# Patient Record
Sex: Male | Born: 1970 | Race: White | Hispanic: No | Marital: Married | Smoking: Never smoker
Health system: Southern US, Community
[De-identification: ages and names within clinical notes are randomized; demographics above are authoritative.]

---

## 2017-07-18 ENCOUNTER — Emergency Department (HOSPITAL_COMMUNITY)
Admission: EM | Admit: 2017-07-18 | Discharge: 2017-07-18 | Disposition: A | Discharge status: Home / Self Care | Payer: BLUE CROSS/BLUE SHIELD | Attending: Emergency Medicine | Admitting: Emergency Medicine

## 2017-07-18 ENCOUNTER — Emergency Department (HOSPITAL_COMMUNITY): Payer: BLUE CROSS/BLUE SHIELD

## 2017-07-18 ENCOUNTER — Other Ambulatory Visit: Payer: Self-pay

## 2017-07-18 ENCOUNTER — Encounter (HOSPITAL_COMMUNITY): Payer: Self-pay | Admitting: Emergency Medicine

## 2017-07-18 DIAGNOSIS — K529 Noninfective gastroenteritis and colitis, unspecified: Secondary | ICD-10-CM | POA: Diagnosis not present

## 2017-07-18 DIAGNOSIS — R1030 Lower abdominal pain, unspecified: Secondary | ICD-10-CM | POA: Diagnosis present

## 2017-07-18 LAB — COMPREHENSIVE METABOLIC PANEL
ALK PHOS: 50 U/L (ref 38–126)
ALT: 21 U/L (ref 17–63)
AST: 15 U/L (ref 15–41)
Albumin: 3.8 g/dL (ref 3.5–5.0)
Anion gap: 11 (ref 5–15)
BILIRUBIN TOTAL: 0.7 mg/dL (ref 0.3–1.2)
BUN: 12 mg/dL (ref 6–20)
CALCIUM: 9.2 mg/dL (ref 8.9–10.3)
CO2: 24 mmol/L (ref 22–32)
CREATININE: 1.17 mg/dL (ref 0.61–1.24)
Chloride: 97 mmol/L — ABNORMAL LOW (ref 101–111)
Glucose, Bld: 106 mg/dL — ABNORMAL HIGH (ref 65–99)
Potassium: 3.4 mmol/L — ABNORMAL LOW (ref 3.5–5.1)
Sodium: 132 mmol/L — ABNORMAL LOW (ref 135–145)
TOTAL PROTEIN: 8.4 g/dL — AB (ref 6.5–8.1)

## 2017-07-18 LAB — LIPASE, BLOOD: Lipase: 25 U/L (ref 11–51)

## 2017-07-18 LAB — CBC
HCT: 47.8 % (ref 39.0–52.0)
Hemoglobin: 16.5 g/dL (ref 13.0–17.0)
MCH: 29 pg (ref 26.0–34.0)
MCHC: 34.5 g/dL (ref 30.0–36.0)
MCV: 84 fL (ref 78.0–100.0)
PLATELETS: 253 10*3/uL (ref 150–400)
RBC: 5.69 MIL/uL (ref 4.22–5.81)
RDW: 14 % (ref 11.5–15.5)
WBC: 8.6 10*3/uL (ref 4.0–10.5)

## 2017-07-18 MED ORDER — AMOXICILLIN-POT CLAVULANATE 875-125 MG PO TABS: 1.0000 | ORAL_TABLET | Freq: Two times a day (BID) | ORAL | 0 refills | Status: AC

## 2017-07-18 MED ORDER — IOPAMIDOL (ISOVUE-300) INJECTION 61%
INTRAVENOUS | Status: AC
  Administered 2017-07-18: 100 mL
  Filled 2017-07-18: qty 100

## 2017-07-18 MED ORDER — ONDANSETRON 4 MG PO TBDP: 4.0000 mg | ORAL_TABLET | Freq: Three times a day (TID) | ORAL | 0 refills | Status: AC | PRN

## 2017-07-18 NOTE — ED Provider Notes (Signed)
Patient placed in Quick Look pathway, seen and evaluated   Chief Complaint: RLQ abdominal pain, diarrhea  HPI:   To the ED with abdominal complaints.  Patient states 3 days ago he began having chills, and yesterday he had nonbloody diarrhea, about 12-14 episodes.  He states today he began having right lower quadrant abdominal pain, worse with movement.  States the diarrhea has significantly decreased.  Denies associated nausea or vomiting, fever, urinary symptoms, or other complaints.  No history of abdominal surgeries.  No recent antibiotics or hospital admissions.  ROS: + Abdominal pain, + diarrhea  Physical Exam:   Gen: No distress  Neuro: Awake and Alert  Skin: Warm    Focused Exam: Abdomen is soft.  No guarding, however positive rebound tenderness in right lower quadrant and left lower quadrant.  + Rovsing.   Initiation of care has begun. The patient has been counseled on the process, plan, and necessity for staying for the completion/evaluation, and the remainder of the medical screening examination    Robinson, [place] N, PA-C 07/18/17 1539    Mancel BaleWentz, Elliott, MD 07/19/17 1026

## 2017-07-18 NOTE — ED Notes (Signed)
Pt. Called for VS. No Answer

## 2017-07-18 NOTE — Discharge Instructions (Signed)
Please read instructions below. Drink clear liquids until your stomach feels better. Then, slowly introduce bland foods into your diet as tolerated, such as bread, rice, apples, bananas. It is important to stay hydrated. You can take zofran every 8 hours as needed for nausea. Begin taking the antibiotic, Augmentin, every 12 hours until it is gone. Follow up with your primary care. Return to the ER for severely worsening abdominal pain, fever, uncontrollable vomiting, or new or concerning symptoms.

## 2017-07-18 NOTE — ED Triage Notes (Signed)
Pt endorses having chills 3 days ago, diarrhea that began yesterday 12-14 times. Pt began having RLQ abd pain this morning that is better with palpation and worse when releasing pressure. Tachy in triage.

## 2017-07-18 NOTE — ED Provider Notes (Signed)
[facility]   CSN: [id number] Arrival date & time: 07/18/17  1444     History   Chief Complaint Chief Complaint  Patient presents with  . Abdominal Pain    HPI Wesley Franklin is a 47 y.o. male without significant PMHX, presenting to the ED with abdominal complaints.  Patient states 3 days ago he began having chills, and yesterday he had nonbloody diarrhea, about 12-14 episodes.  He states today he began having right lower quadrant abdominal pain, worse with movement. Pain is minimal at rest. States the diarrhea has significantly decreased since that time.  Denies associated nausea or vomiting, fever, urinary symptoms, or other complaints.  No history of abdominal surgeries.  No recent antibiotics or hospital admissions. No recent out of country travel. Denies drinking from questionable water sources.  The history is provided by the patient.    History reviewed. No pertinent past medical history.  There are no active problems to display for this patient.   History reviewed. No pertinent surgical history.     Home Medications    Prior to Admission medications   Medication Sig Start Date End Date Taking? Authorizing Provider  amoxicillin-clavulanate (AUGMENTIN) 875-125 MG tablet Take 1 tablet by mouth every 12 (twelve) hours for 10 days. 07/18/17 07/28/17  Robinson, [place] N, PA-C  ondansetron (ZOFRAN ODT) 4 MG disintegrating tablet Take 1 tablet (4 mg total) by mouth every 8 (eight) hours as needed for nausea or vomiting. 07/18/17   Robinson, [place] N, PA-C    Family History History reviewed. No pertinent family history.  Social History Social History   Tobacco Use  . Smoking status: Never Smoker  Substance Use Topics  . Alcohol use: No    Frequency: Never  . Drug use: No     Allergies   Patient has no known allergies.   Review of Systems Review of Systems  Constitutional: Positive for chills. Negative for  fever.  Gastrointestinal: Positive for abdominal pain and diarrhea. Negative for blood in stool, nausea and vomiting.  Genitourinary: Negative for dysuria and frequency.  Allergic/Immunologic: Negative for immunocompromised state.  All other systems reviewed and are negative.    Physical Exam Updated Vital Signs BP 122/90   Pulse 90   Temp 99.2 F (37.3 C) (Oral)   Resp 16   Ht 6' (1.829 m)   Wt 90.7 kg (200 lb)   SpO2 96%   BMI 27.12 kg/m   Physical Exam  Constitutional: He appears well-developed and well-nourished.  Non-toxic appearance. He does not appear ill. No distress.  HENT:  Head: Normocephalic and atraumatic.  Mouth/Throat: Oropharynx is clear and moist.  Eyes: Conjunctivae are normal.  Cardiovascular: Normal rate, regular rhythm, normal heart sounds and intact distal pulses.  Pulmonary/Chest: Effort normal and breath sounds normal. No respiratory distress.  Abdominal: Soft. Normal appearance and bowel sounds are normal. He exhibits no distension and no mass. There is rebound. There is no rigidity and no guarding. No hernia.  No tenderness with direction palpation though rebound tenderness is present RLQ and LLQ. Positive Rovsing's.   Neurological: He is alert.  Skin: Skin is warm.  Psychiatric: He has a normal mood and affect. His behavior is normal.  Nursing note and vitals reviewed.    ED Treatments / Results  Labs (all labs ordered are listed, but only abnormal results are displayed) Labs Reviewed  COMPREHENSIVE METABOLIC PANEL - Abnormal; Notable for the following components:      Result  Value   Sodium 132 (*)    Potassium 3.4 (*)    Chloride 97 (*)    Glucose, Bld 106 (*)    Total Protein 8.4 (*)    All other components within normal limits  LIPASE, BLOOD  CBC    EKG  EKG Interpretation None       Radiology Ct Abdomen Pelvis W Contrast  Result Date: 07/18/2017 CLINICAL DATA:  Right lower quadrant pain with diarrhea and chills. EXAM: CT  ABDOMEN AND PELVIS WITH CONTRAST TECHNIQUE: Multidetector CT imaging of the abdomen and pelvis was performed using the standard protocol following bolus administration of intravenous contrast. CONTRAST:  ISOVUE-300 IOPAMIDOL (ISOVUE-300) INJECTION 61% COMPARISON:  None. FINDINGS: Lower chest: Unremarkable. Hepatobiliary: No focal abnormality within the liver parenchyma. There is no evidence for gallstones, gallbladder wall thickening, or pericholecystic fluid. No intrahepatic or extrahepatic biliary dilation. Pancreas: No focal mass lesion. No dilatation of the main duct. No intraparenchymal cyst. No peripancreatic edema. Spleen: No splenomegaly. No focal mass lesion. Adrenals/Urinary Tract: No adrenal nodule or mass. Central sinus and parenchymal cysts noted left kidney. Right kidney unremarkable. No evidence for hydroureter. The urinary bladder appears normal for the degree of distention. Stomach/Bowel: Stomach is nondistended. No gastric wall thickening. No evidence of outlet obstruction. Duodenum is normally positioned as is the ligament of Treitz. No small bowel wall thickening. No small bowel dilatation. Terminal ileum unremarkable. The appendix is normal. Wall thickening is identified in the right colon with pericolonic edema/inflammation. Numerous small lymph nodes are seen in the pericolonic fat of the ileocolic mesentery. Transverse and left colon unremarkable. Vascular/Lymphatic: No abdominal aortic aneurysm. No abdominal aortic atherosclerotic calcification. Portal vein and superior mesenteric vein are patent. Celiac axis, SMA, and IMA are patent. No retroperitoneal lymphadenopathy. No pelvic sidewall lymphadenopathy. Reproductive: The prostate gland and seminal vesicles have normal imaging features. Other: Small volume intraperitoneal free fluid evident. Musculoskeletal: Bone windows reveal no worrisome lytic or sclerotic osseous lesions. IMPRESSION: 1. Circumferential wall thickening and edema in  the cecum with pericolonic edema/inflammation and adjacent mild lymphadenopathy in the ileocolic mesentery. Imaging features most suggestive of an infectious or inflammatory right colitis. Electronically Signed   By: Kennith Center M.D.   On: 07/18/2017 17:56    Procedures Procedures (including critical care time)  Medications Ordered in ED Medications  iopamidol (ISOVUE-300) 61 % injection (100 mLs  Contrast Given 07/18/17 1727)     Initial Impression / Assessment and Plan / ED Course  I have reviewed the triage vital signs and the nursing notes.  Pertinent labs & imaging results that were available during my care of the patient were reviewed by me and considered in my medical decision making (see chart for details).    Pt w RLQ abdominal pain and diarrhea. Patient is nontoxic, nonseptic appearing, in no apparent distress.  Patient's pain and other symptoms adequately managed in emergency department.  Labs, imaging and vitals reviewed. Ct consistent with infectious vs inflammatory colitis. Presentation is consistent with this finding as well. Patient does not meet the SIRS or Sepsis criteria. Pt without nausea or vomiting in the ED, tolerating PO. Will discharge with Augmentin, as pt reports adverse reaction to fluoroquinolones. Strict return precautions discussed. Given instructions for follow-up with their primary care physician.  Pt safe for discharge.  Patient discussed with Dr. Effie Shy.  Discussed results, findings, treatment and follow up. Patient advised of return precautions. Patient verbalized understanding and agreed with plan.  Final Clinical Impressions(s) / ED Diagnoses  Final diagnoses:  Colitis    ED Discharge Orders        Ordered    amoxicillin-clavulanate (AUGMENTIN) 875-125 MG tablet  Every 12 hours     07/18/17 2145    ondansetron (ZOFRAN ODT) 4 MG disintegrating tablet  Every 8 hours PRN     07/18/17 2145       Robinson, [place] N, PA-C 07/18/17 2157      Mancel Bale, MD 07/19/17 1026

## 2017-07-18 NOTE — ED Notes (Addendum)
Please note: Pt is in hallway A.

## 2019-05-03 IMAGING — CT CT ABD-PELV W/ CM
2 of 5 series · 16 of 46 positions shown, 18 images · IV contrast (APPLIED)
Comparison: None.

CLINICAL DATA: Right lower quadrant pain with diarrhea and chills.

EXAM:
CT ABDOMEN AND PELVIS WITH CONTRAST
TECHNIQUE: Multidetector CT imaging of the abdomen and pelvis was performed
using the standard protocol following bolus administration of
intravenous contrast.
CONTRAST:  100mL SDWRS0-VCC IOPAMIDOL (SDWRS0-VCC) INJECTION 61%

[Series 3: abd/ pelvis 5.0 i30f 2 · axial · 0.85mm/px · z∈[+884,+1344]mm · 13 of 104 slices shown, 15 images]
[im 6/104  soft-tissue]
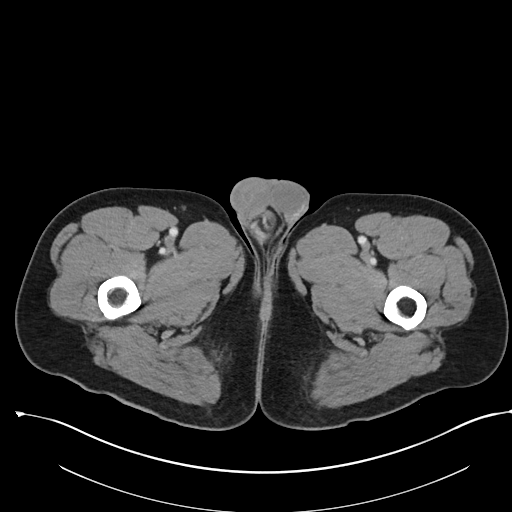
[im 6/104  bone]
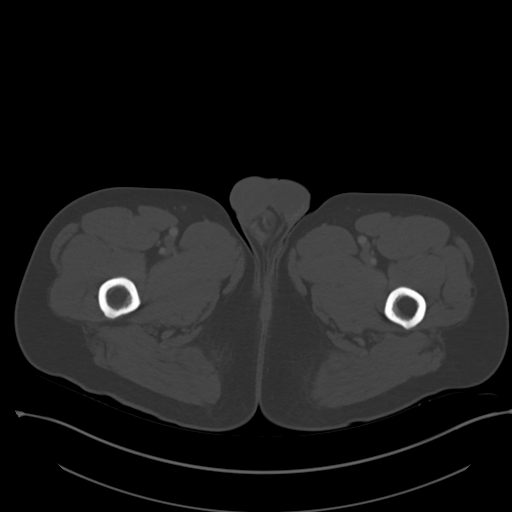
[im 16/104  soft-tissue]
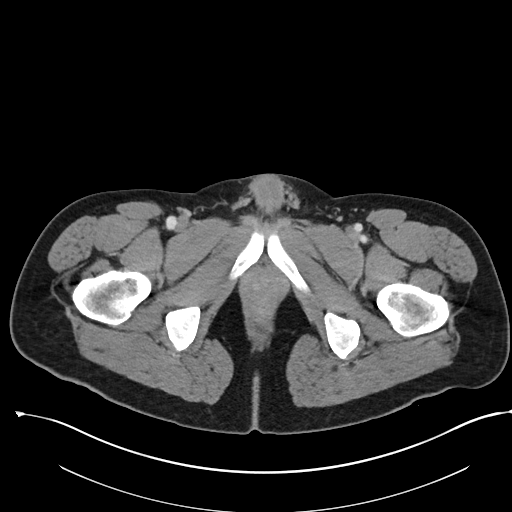
[im 21/104  soft-tissue]
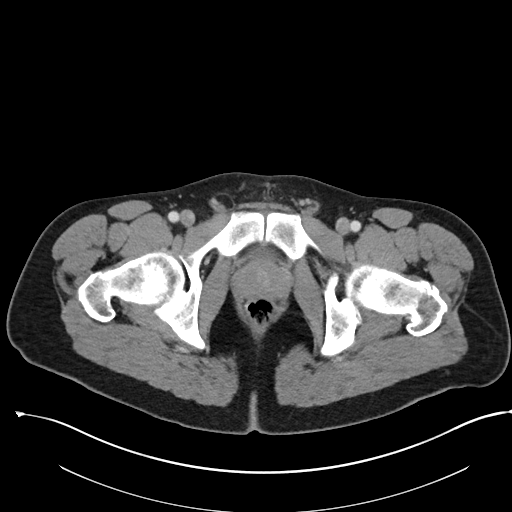
[im 31/104  soft-tissue]
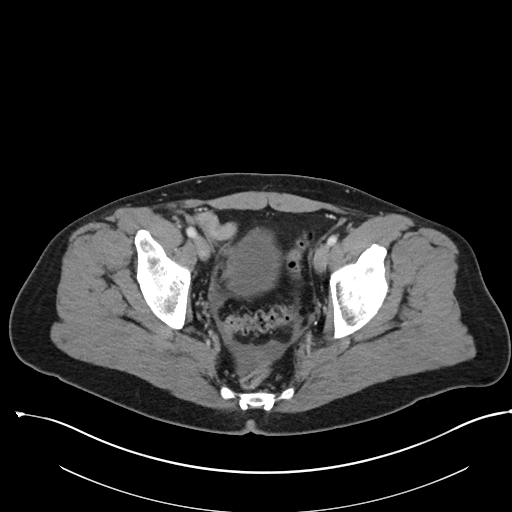
[im 37/104  soft-tissue]
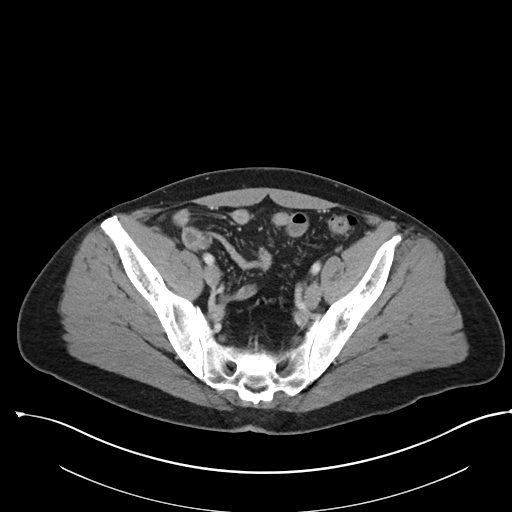
[im 47/104  soft-tissue]
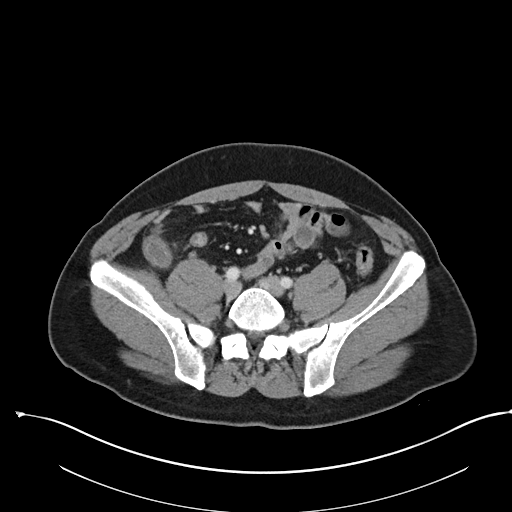
[im 52/104  soft-tissue]
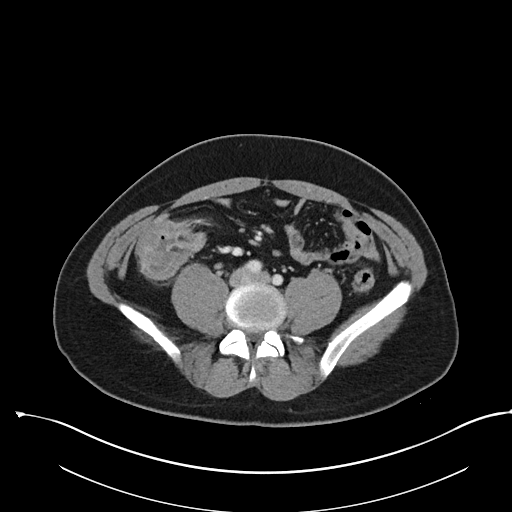
[im 57/104  soft-tissue]
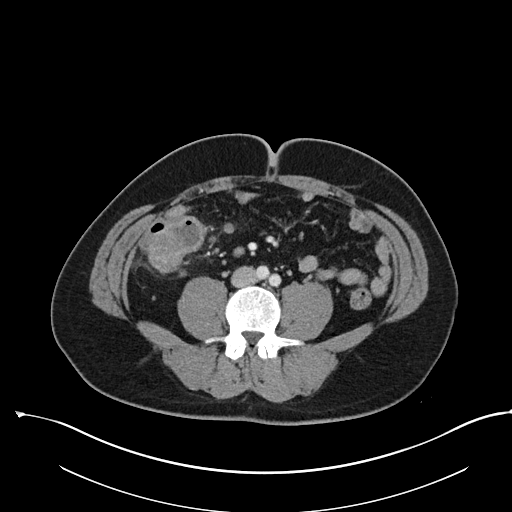
[im 67/104  soft-tissue]
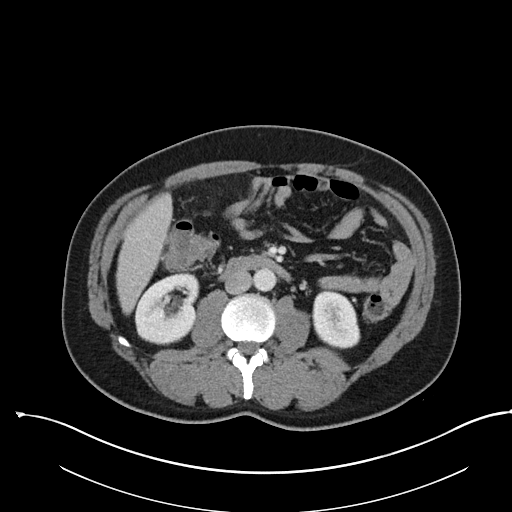
[im 67/104  bone]
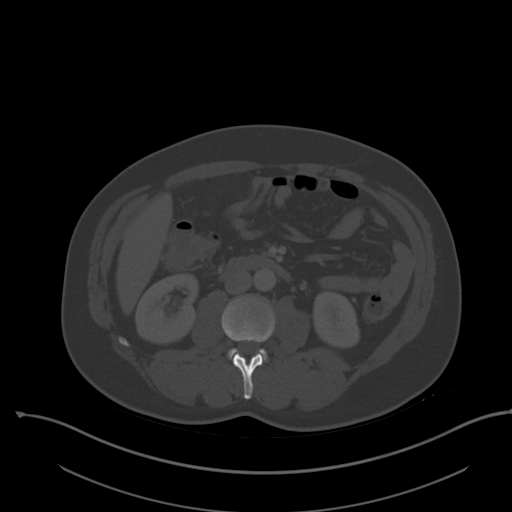
[im 73/104  soft-tissue]
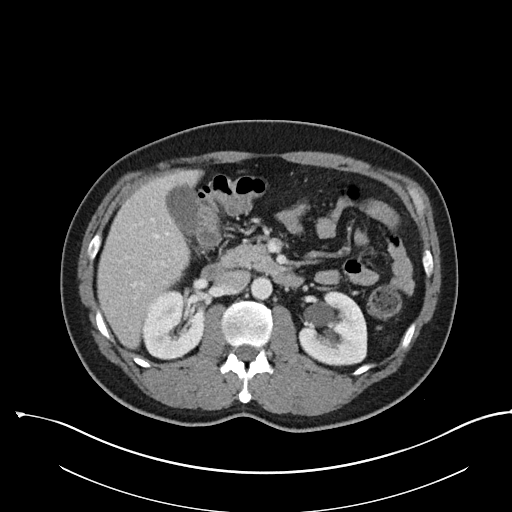
[im 83/104  soft-tissue]
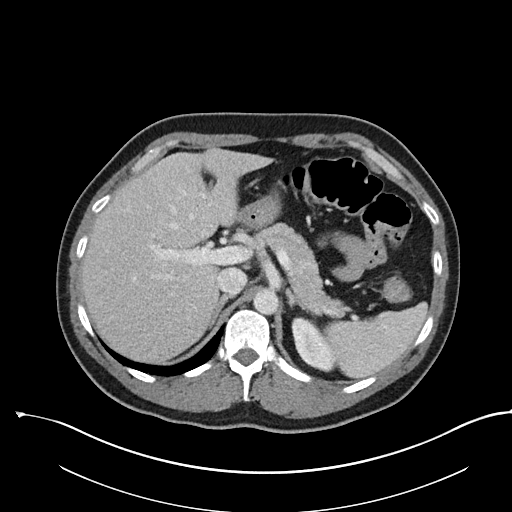
[im 88/104  soft-tissue]
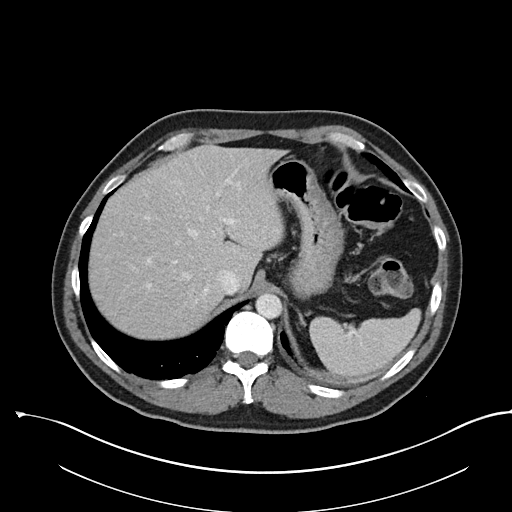
[im 98/104  soft-tissue]
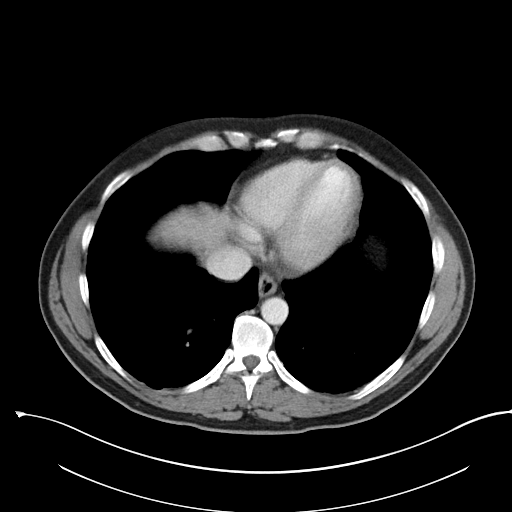

[Series 6: coronal soft tissue · coronal · 0.85mm/px · 3 of 93 slices shown]
[im 31/93  soft-tissue]
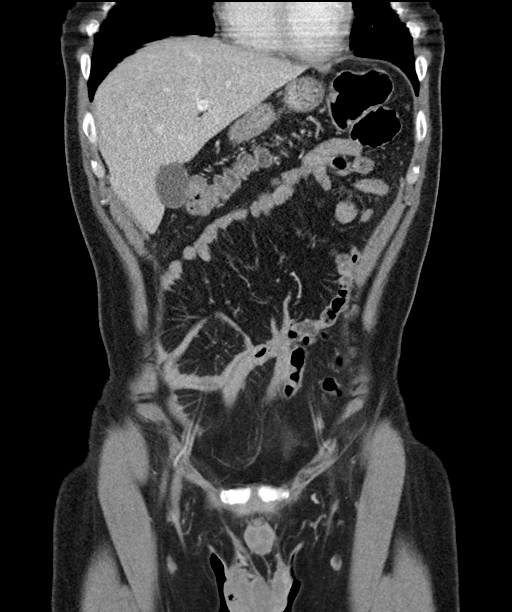
[im 41/93  soft-tissue]
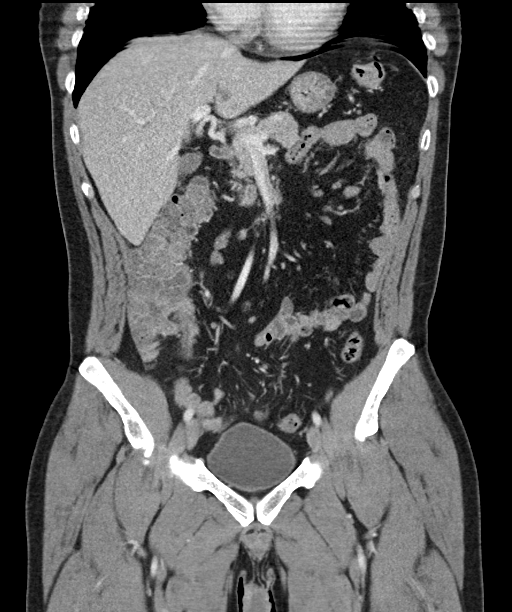
[im 52/93  soft-tissue]
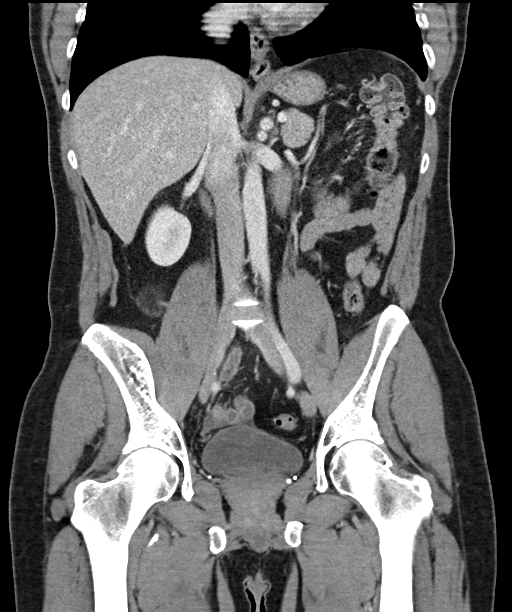

[16 of 46 positions shown; findings below may reference images not displayed]

FINDINGS: Lower chest: Unremarkable.

Hepatobiliary: No focal abnormality within the liver parenchyma.
There is no evidence for gallstones, gallbladder wall thickening, or
pericholecystic fluid. No intrahepatic or extrahepatic biliary
dilation.

Pancreas: No focal mass lesion. No dilatation of the main duct. No
intraparenchymal cyst. No peripancreatic edema.

Spleen: No splenomegaly. No focal mass lesion.

Adrenals/Urinary Tract: No adrenal nodule or mass. Central sinus and
parenchymal cysts noted left kidney. Right kidney unremarkable. No
evidence for hydroureter. The urinary bladder appears normal for the
degree of distention.

Stomach/Bowel: Stomach is nondistended. No gastric wall thickening.
No evidence of outlet obstruction. Duodenum is normally positioned
as is the ligament of Treitz. No small bowel wall thickening. No
small bowel dilatation. Terminal ileum unremarkable. The appendix is
normal. Wall thickening is identified in the right colon with
pericolonic edema/inflammation. Numerous small lymph nodes are seen
in the pericolonic fat of the ileocolic mesentery. Transverse and
left colon unremarkable.

Vascular/Lymphatic: No abdominal aortic aneurysm. No abdominal
aortic atherosclerotic calcification. Portal vein and superior
mesenteric vein are patent. Celiac axis, SMA, and IMA are patent. No
retroperitoneal lymphadenopathy. No pelvic sidewall lymphadenopathy.

Reproductive: The prostate gland and seminal vesicles have normal
imaging features.

Other: Small volume intraperitoneal free fluid evident.

Musculoskeletal: Bone windows reveal no worrisome lytic or sclerotic
osseous lesions.
IMPRESSION: 1. Circumferential wall thickening and edema in the cecum with
pericolonic edema/inflammation and adjacent mild lymphadenopathy in
the ileocolic mesentery. Imaging features most suggestive of an
infectious or inflammatory right colitis.
